# Patient Record
Sex: Female | Born: 1970 | Race: Black or African American | Hispanic: No | Marital: Married | Smoking: Never smoker
Health system: Southern US, Community
[De-identification: ages and names within clinical notes are randomized; demographics above are authoritative.]

---

## 1998-06-28 ENCOUNTER — Other Ambulatory Visit: Admission: RE | Admit: 1998-06-28 | Discharge: 1998-06-28 | Payer: Self-pay | Admitting: *Deleted

## 1999-04-01 ENCOUNTER — Encounter: Payer: Self-pay | Admitting: Emergency Medicine

## 1999-04-01 ENCOUNTER — Emergency Department (HOSPITAL_COMMUNITY): Admission: EM | Admit: 1999-04-01 | Discharge: 1999-04-01 | Payer: Self-pay | Admitting: Emergency Medicine

## 2000-01-29 ENCOUNTER — Other Ambulatory Visit: Admission: RE | Admit: 2000-01-29 | Discharge: 2000-01-29 | Payer: Self-pay | Admitting: *Deleted

## 2001-09-21 ENCOUNTER — Other Ambulatory Visit: Admission: RE | Admit: 2001-09-21 | Discharge: 2001-09-21 | Payer: Self-pay | Admitting: *Deleted

## 2002-07-23 ENCOUNTER — Encounter: Admission: RE | Admit: 2002-07-23 | Discharge: 2002-07-23 | Payer: Self-pay | Admitting: Specialist

## 2002-07-23 ENCOUNTER — Encounter: Payer: Self-pay | Admitting: Specialist

## 2002-10-21 ENCOUNTER — Other Ambulatory Visit: Admission: RE | Admit: 2002-10-21 | Discharge: 2002-10-21 | Payer: Self-pay | Admitting: *Deleted

## 2003-11-16 ENCOUNTER — Inpatient Hospital Stay (HOSPITAL_COMMUNITY): Admission: AD | Admit: 2003-11-16 | Discharge: 2003-11-16 | Payer: Self-pay | Admitting: *Deleted

## 2004-08-05 ENCOUNTER — Inpatient Hospital Stay (HOSPITAL_COMMUNITY): Admission: AD | Admit: 2004-08-05 | Discharge: 2004-08-05 | Payer: Self-pay | Admitting: Obstetrics and Gynecology

## 2004-09-21 ENCOUNTER — Inpatient Hospital Stay (HOSPITAL_COMMUNITY): Admission: AD | Admit: 2004-09-21 | Discharge: 2004-09-21 | Payer: Self-pay | Admitting: Obstetrics and Gynecology

## 2004-10-09 ENCOUNTER — Inpatient Hospital Stay (HOSPITAL_COMMUNITY): Admission: AD | Admit: 2004-10-09 | Discharge: 2004-10-12 | Payer: Self-pay | Admitting: Obstetrics and Gynecology

## 2006-03-18 ENCOUNTER — Encounter: Admission: RE | Admit: 2006-03-18 | Discharge: 2006-03-18 | Payer: Self-pay | Admitting: *Deleted

## 2006-06-21 IMAGING — US US OB COMP LESS 14 WK
1 series · 18 of 24 positions shown · non-contrast
Comparison: none

CLINICAL DATA: Known IUP.  Vaginal bleeding.  
 EARLY OBSTETRICAL ULTRASOUND:
 Transabdominal scanning reveals an intrauterine gestational sac which is low in the uterine cavity, near the internal cervical os.  Gestational sac appears small and irregular.  Embryo is identified although no yolk sac can be appreciated.  Embryonic cardiac activity is apparent by 2D grayscale and M-mode Doppler imaging with a measured rate of 66 bpm.
 Ovaries are sonographically normal with a right ovarian corpus luteum cyst.  There is no free fluid in the cul-de-sac.

[Series 1: us ob comp<14 wk · 18 of 24 slices shown]
[im 1/24]
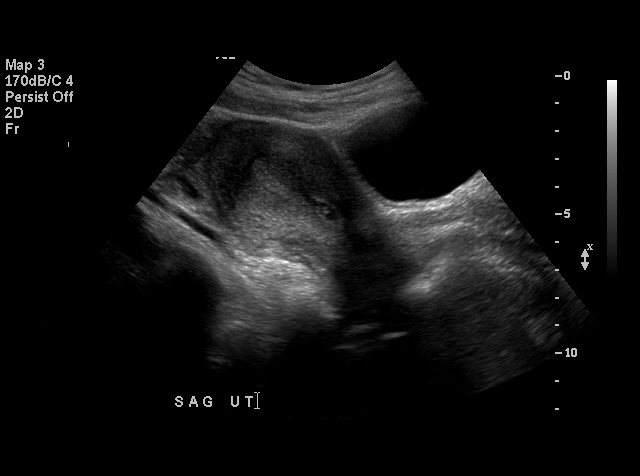
[im 3/24]
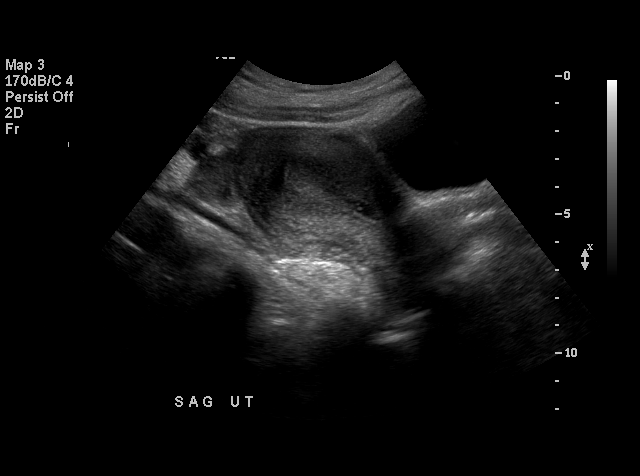
[im 4/24]
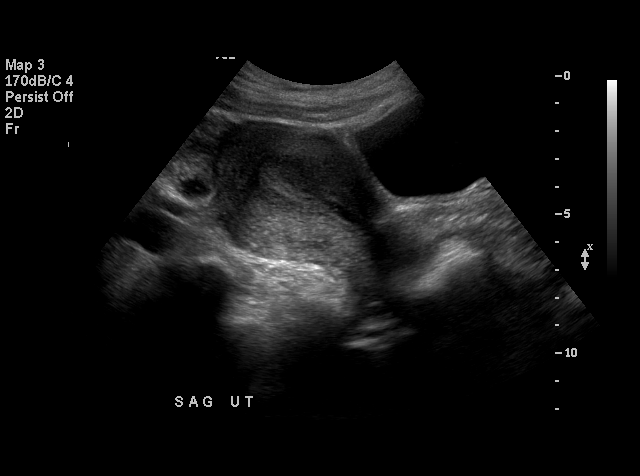
[im 5/24]
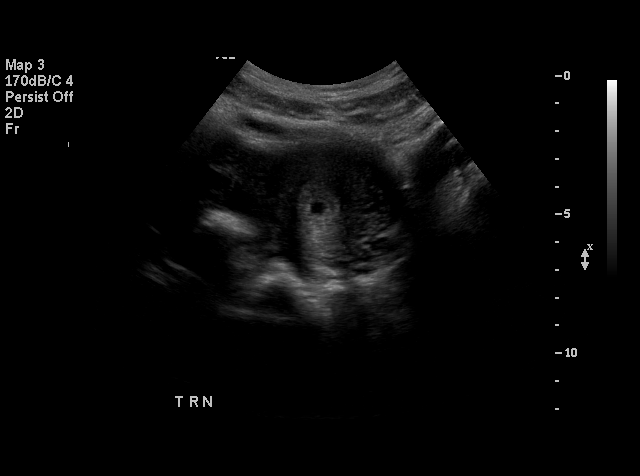
[im 7/24]
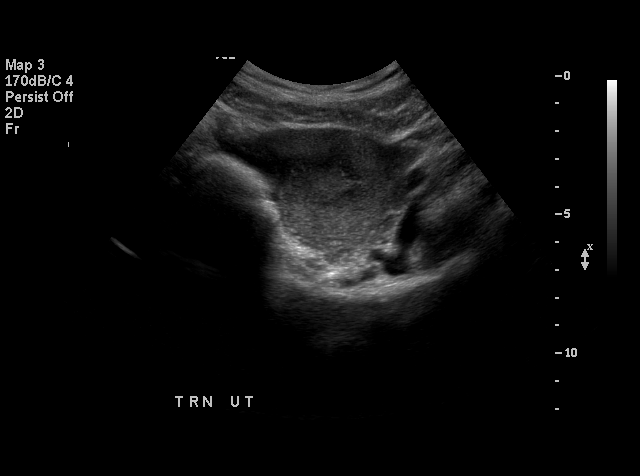
[im 8/24]
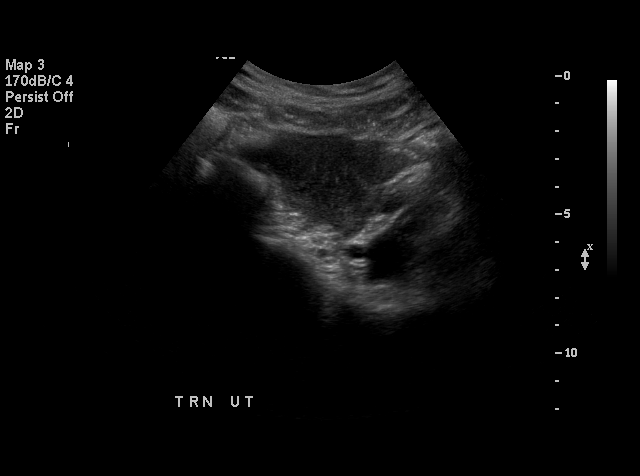
[im 9/24]
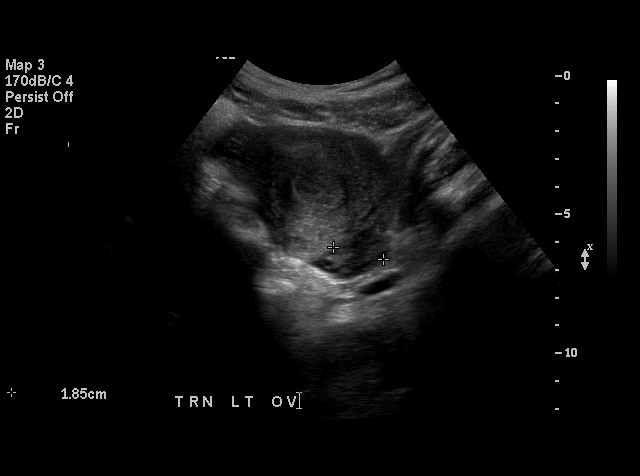
[im 11/24]
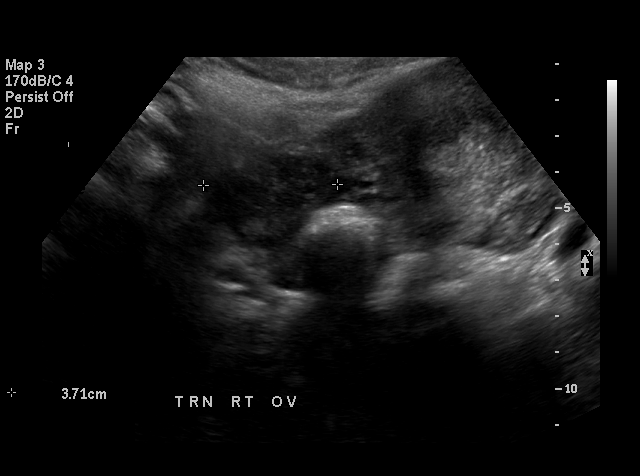
[im 12/24]
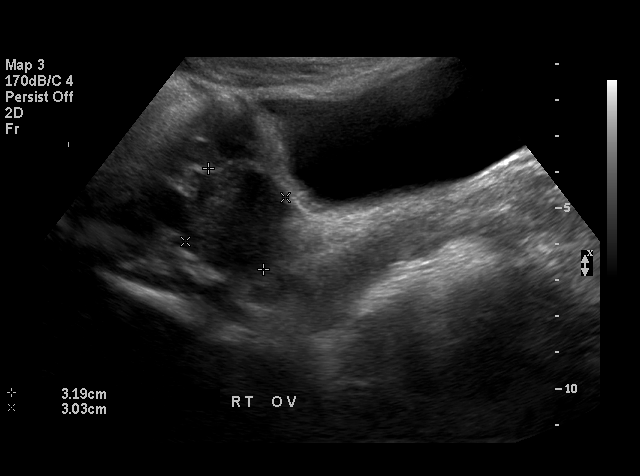
[im 13/24]
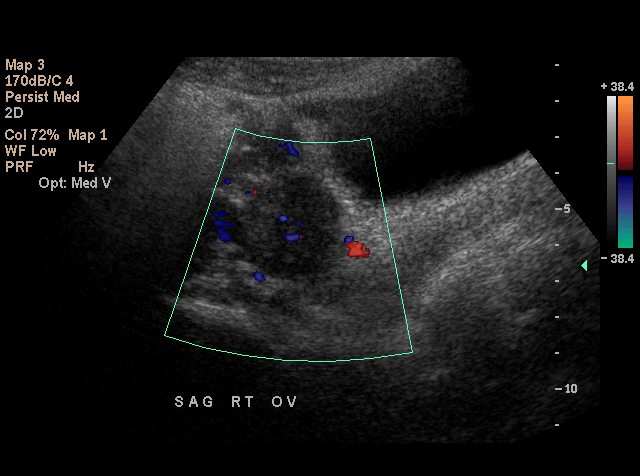
[im 15/24]
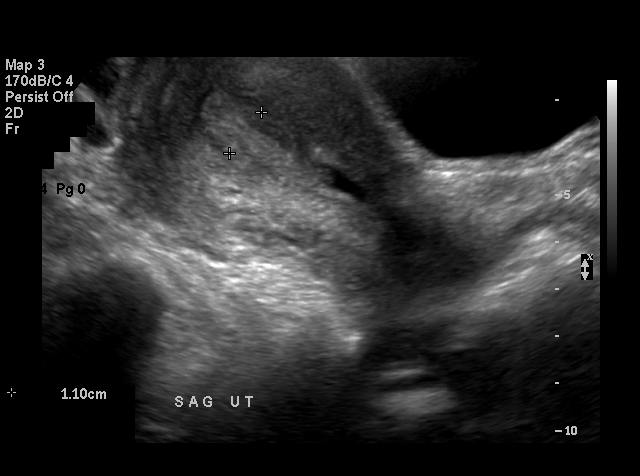
[im 16/24]
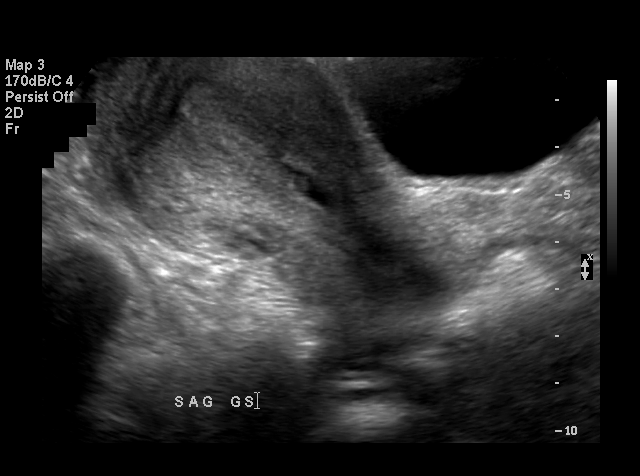
[im 17/24]
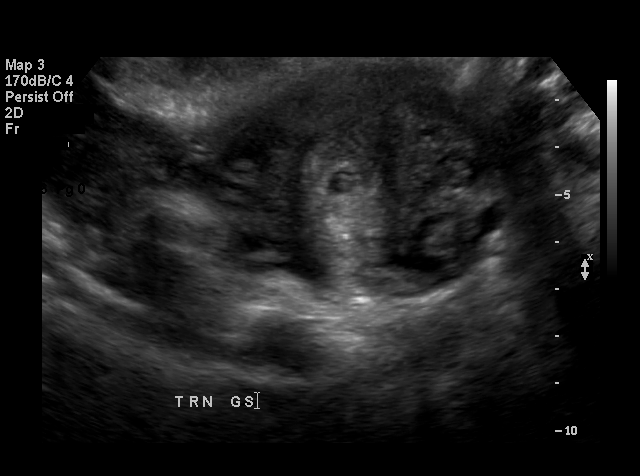
[im 19/24]
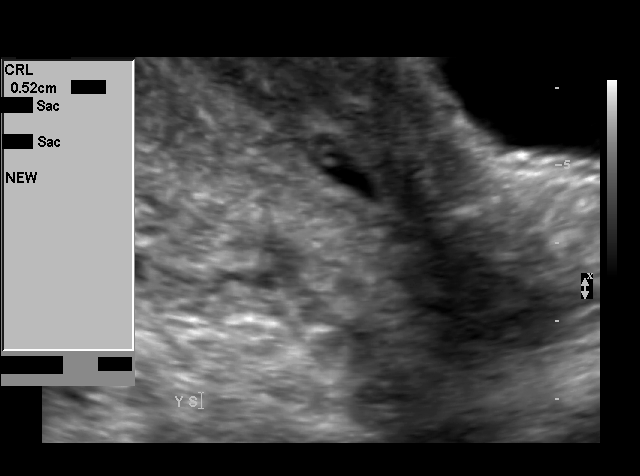
[im 20/24]
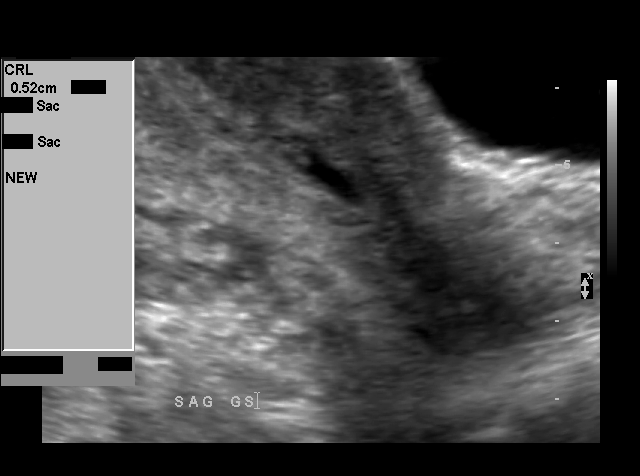
[im 21/24]
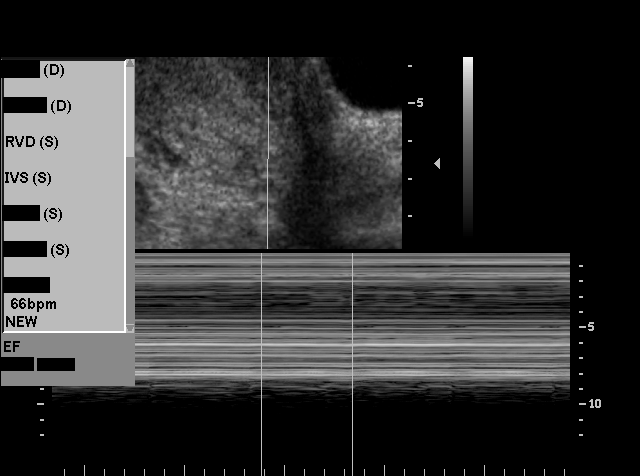
[im 23/24]
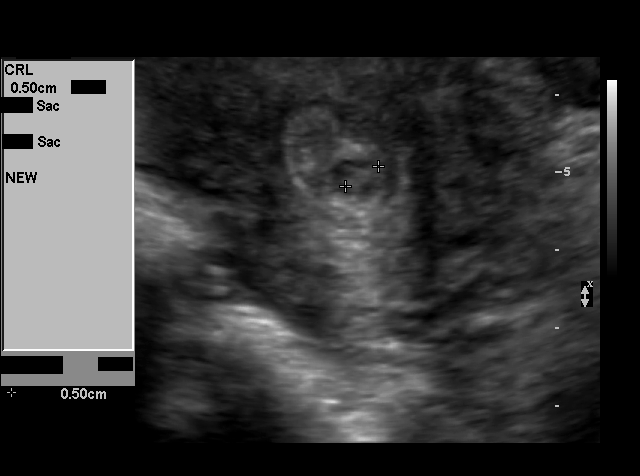
[im 24/24]
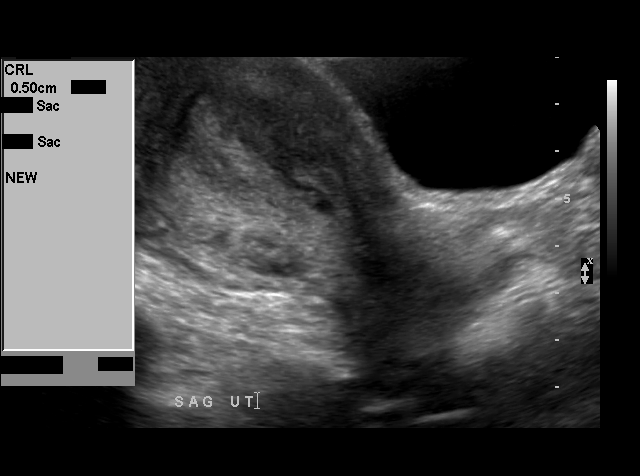

[18 of 24 positions shown; findings below may reference images not displayed]

IMPRESSION: Features consistent with abortion in progress.  A preliminary report of this study was sent to the [facility] at the time of study interpretation.

## 2010-03-17 ENCOUNTER — Encounter: Payer: Self-pay | Admitting: *Deleted

## 2010-03-18 ENCOUNTER — Encounter: Payer: Self-pay | Admitting: *Deleted

## 2011-07-26 ENCOUNTER — Other Ambulatory Visit: Payer: Self-pay | Admitting: Obstetrics and Gynecology

## 2011-07-26 DIAGNOSIS — Z1231 Encounter for screening mammogram for malignant neoplasm of breast: Secondary | ICD-10-CM

## 2011-08-09 ENCOUNTER — Ambulatory Visit: Payer: Self-pay

## 2011-08-15 ENCOUNTER — Ambulatory Visit: Payer: Self-pay

## 2013-03-18 ENCOUNTER — Other Ambulatory Visit: Payer: Self-pay

## 2013-03-18 DIAGNOSIS — Z1231 Encounter for screening mammogram for malignant neoplasm of breast: Secondary | ICD-10-CM

## 2013-05-17 ENCOUNTER — Other Ambulatory Visit: Payer: Self-pay

## 2013-05-17 ENCOUNTER — Ambulatory Visit
Admission: RE | Admit: 2013-05-17 | Discharge: 2013-05-17 | Disposition: A | Discharge status: Home / Self Care | Payer: BC Managed Care – PPO | Source: Ambulatory Visit

## 2013-05-17 DIAGNOSIS — Z1231 Encounter for screening mammogram for malignant neoplasm of breast: Secondary | ICD-10-CM

## 2015-02-14 ENCOUNTER — Other Ambulatory Visit: Payer: Self-pay

## 2015-02-14 DIAGNOSIS — Z1231 Encounter for screening mammogram for malignant neoplasm of breast: Secondary | ICD-10-CM

## 2015-03-15 ENCOUNTER — Ambulatory Visit
Admission: RE | Admit: 2015-03-15 | Discharge: 2015-03-15 | Disposition: A | Discharge status: Home / Self Care | Payer: BLUE CROSS/BLUE SHIELD | Source: Ambulatory Visit

## 2015-03-15 DIAGNOSIS — Z1231 Encounter for screening mammogram for malignant neoplasm of breast: Secondary | ICD-10-CM

## 2017-12-03 ENCOUNTER — Ambulatory Visit: Payer: Self-pay | Admitting: Podiatry

## 2017-12-24 ENCOUNTER — Ambulatory Visit: Payer: Self-pay | Admitting: Podiatry

## 2017-12-24 ENCOUNTER — Ambulatory Visit (INDEPENDENT_AMBULATORY_CARE_PROVIDER_SITE_OTHER): Payer: BLUE CROSS/BLUE SHIELD

## 2017-12-24 ENCOUNTER — Encounter: Payer: Self-pay | Admitting: Podiatry

## 2017-12-24 DIAGNOSIS — M2041 Other hammer toe(s) (acquired), right foot: Secondary | ICD-10-CM | POA: Diagnosis not present

## 2017-12-24 DIAGNOSIS — B078 Other viral warts: Secondary | ICD-10-CM | POA: Diagnosis not present

## 2017-12-29 ENCOUNTER — Ambulatory Visit: Payer: BLUE CROSS/BLUE SHIELD | Admitting: Podiatry

## 2017-12-29 DIAGNOSIS — B078 Other viral warts: Secondary | ICD-10-CM

## 2017-12-29 NOTE — Progress Notes (Signed)
   Subjective: 47 year old female presenting today as a new patient, referred by Dr. Elijah Birk, with a chief complaint of a painful lesion noted to the right fifth toe that has been present for the past several months. Dr. Elijah Birk told the patient it was a bone spur. She states wearing shoes causes the lesion to rub against them which increases the pain. She has not done anything for treatment. Patient is here for further evaluation and treatment.   History reviewed. No pertinent past medical history.  Objective: Physical Exam General: The patient is alert and oriented x3 in no acute distress.  Dermatology: Hyperkeratotic skin lesion noted to the 5th toe of the right foot approximately 1 cm in diameter. Pinpoint bleeding noted upon debridement. Skin is warm, dry and supple bilateral lower extremities. Negative for open lesions or macerations.  Vascular: Palpable pedal pulses bilaterally. No edema or erythema noted. Capillary refill within normal limits.  Neurological: Epicritic and protective threshold grossly intact bilaterally.   Musculoskeletal Exam: Pain on palpation to the note skin lesion.  Range of motion within normal limits to all pedal and ankle joints bilateral. Muscle strength 5/5 in all groups bilateral.   Radiographic Exam:  Normal osseous mineralization. Joint spaces preserved. No fracture/dislocation/boney destruction.    Assessment: #1 wart right fifth toe   Plan of Care:  #1 Patient was evaluated. X-Rays reviewed.  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 She does not want a procedure done today.  #3 Return to clinic Monday 12/29/17 for excision of wart.    Felecia Shelling, DPM [facility]  Dr. Felecia Shelling, DPM    [address]                                        [place], [place] [address]                Office [phone]  Fax [phone]

## 2017-12-29 NOTE — Progress Notes (Signed)
   Subjective: 47 year old female presenting today as a new patient, referred by Dr. Elijah Birk, with a chief complaint of a painful lesion noted to the right fifth toe that has been present for the past several months.  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  She presents today for correction and surgical excision of the wart lesion.   No past medical history on file.  Objective: Physical Exam General: The patient is alert and oriented x3 in no acute distress.  Dermatology: Hyperkeratotic skin lesion noted to the 5th toe of the right foot approximately 1 cm in diameter. Pinpoint bleeding noted upon debridement. Skin is warm, dry and supple bilateral lower extremities. Negative for open lesions or macerations.  Vascular: Palpable pedal pulses bilaterally. No edema or erythema noted. Capillary refill within normal limits.  Neurological: Epicritic and protective threshold grossly intact bilaterally.   Musculoskeletal Exam: Pain on palpation to the note skin lesion.  Range of motion within normal limits to all pedal and ankle joints bilateral. Muscle strength 5/5 in all groups bilateral.   Radiographic Exam:  Normal osseous mineralization. Joint spaces preserved. No fracture/dislocation/boney destruction.    Assessment: #1 wart right fifth toe   Plan of Care:  #1 Patient was evaluated.   #2  The toe was prepped in aseptic manner and 3 cc of 2% lidocaine plain infiltrated and a digital block fashion. #3 the wart lesion was circumscribed with a surgical #11 blade and curette was utilized to remove the wart.  Wart was removed in toto with dry sterile dressing applied. #4 prescription for gentamicin cream.  Recommend gentamicin cream and a Band-Aid daily #5 return to clinic in 10 days  *Patient is leaving for [place] on 01/10/2018  Crystal Rubio, DPM [facility]  Dr. Felecia Rubio, DPM    [address]                                        [place], [place] [address]                Office [phone]  Fax [phone]

## 2017-12-31 ENCOUNTER — Telehealth: Payer: Self-pay | Admitting: Podiatry

## 2017-12-31 MED ORDER — GENTAMICIN SULFATE 0.1 % EX OINT: TOPICAL_OINTMENT | CUTANEOUS | 0 refills | Status: AC

## 2017-12-31 NOTE — Telephone Encounter (Signed)
I informed pt the Garamycin ointment had been sent to the [id number], and I apologized for the delay.

## 2017-12-31 NOTE — Addendum Note (Signed)
Addended by: Alphia Kava D on: 12/31/2017 11:24 AM   Modules accepted: Orders

## 2017-12-31 NOTE — Telephone Encounter (Signed)
Patient is waiting on Anti_biotic cream to be called into [facility] and it hasn't been done yet. She was seen on 12/29/2017 by Dr. Logan Bores

## 2018-01-07 ENCOUNTER — Encounter: Payer: Self-pay | Admitting: Podiatry

## 2018-01-07 ENCOUNTER — Ambulatory Visit: Payer: BLUE CROSS/BLUE SHIELD | Admitting: Podiatry

## 2018-01-07 DIAGNOSIS — B078 Other viral warts: Secondary | ICD-10-CM

## 2018-01-07 NOTE — Progress Notes (Signed)
   HPI: Healthy female presents the office today for follow-up evaluation regarding surgical excision of the wart to the fifth digit right foot.  Patient was last seen on 12/29/2017 at which time a digital block was performed and the lesion was removed in toto.  Patient states that she has no pain today.  She is healing nicely.  She has been applying Neosporin and a Band-Aid.  History reviewed. No pertinent past medical history.   Physical Exam: General: The patient is alert and oriented x3 in no acute distress.  Dermatology: Surgical excision site of the wart lesion to the fifth digit right foot appears to be resolved.  There is a very superficial partial-thickness ulcer overlying the area of the fifth digit but it is stable and intact with no drainage.  Skin is warm, dry and supple bilateral lower extremities.  Vascular: Palpable pedal pulses bilaterally. No edema or erythema noted. Capillary refill within normal limits.  Neurological: Epicritic and protective threshold grossly intact bilaterally.   Musculoskeletal Exam: Range of motion within normal limits to all pedal and ankle joints bilateral. Muscle strength 5/5 in all groups bilateral.   Assessment: 1.  Status post excision of a postsurgical wart lesion right fifth toe-resolved   Plan of Care:  1. Patient evaluated.  2.  Continue Neosporin and a Band-Aid for an additional week 3.  At this point the patient can resume full activity no restrictions. 4.  Return to clinic as needed      Felecia ShellingBrent M. Lacrystal Barbe, DPM [facility]  Dr. Felecia ShellingBrent M. Jezreel Justiniano, DPM    [address]                                        [place], [place] [id number]                Office [phone]  Fax [phone]

## 2018-02-05 ENCOUNTER — Other Ambulatory Visit: Payer: Self-pay | Admitting: Obstetrics & Gynecology

## 2018-02-05 DIAGNOSIS — N6310 Unspecified lump in the right breast, unspecified quadrant: Secondary | ICD-10-CM

## 2018-02-10 ENCOUNTER — Other Ambulatory Visit: Payer: BLUE CROSS/BLUE SHIELD
